# Patient Record
Sex: Female | Born: 1960 | Race: Black or African American | Hispanic: No | Smoking: Never smoker
Health system: Southern US, Community
[De-identification: ages and names within clinical notes are randomized; demographics above are authoritative.]

## PROBLEM LIST (undated history)

## (undated) DIAGNOSIS — I10 Essential (primary) hypertension: Secondary | ICD-10-CM

---

## 2014-09-15 ENCOUNTER — Encounter (HOSPITAL_COMMUNITY): Payer: Self-pay

## 2014-09-15 ENCOUNTER — Emergency Department (HOSPITAL_COMMUNITY): Payer: BC Managed Care – PPO

## 2014-09-15 ENCOUNTER — Emergency Department (HOSPITAL_COMMUNITY)
Admission: EM | Admit: 2014-09-15 | Discharge: 2014-09-15 | Disposition: A | Discharge status: Home / Self Care | Payer: BC Managed Care – PPO | Attending: Emergency Medicine | Admitting: Emergency Medicine

## 2014-09-15 DIAGNOSIS — Z79899 Other long term (current) drug therapy: Secondary | ICD-10-CM | POA: Diagnosis not present

## 2014-09-15 DIAGNOSIS — M545 Low back pain, unspecified: Secondary | ICD-10-CM

## 2014-09-15 DIAGNOSIS — I10 Essential (primary) hypertension: Secondary | ICD-10-CM

## 2014-09-15 DIAGNOSIS — R112 Nausea with vomiting, unspecified: Secondary | ICD-10-CM | POA: Diagnosis not present

## 2014-09-15 DIAGNOSIS — R109 Unspecified abdominal pain: Secondary | ICD-10-CM | POA: Diagnosis present

## 2014-09-15 HISTORY — DX: Essential (primary) hypertension: I10

## 2014-09-15 LAB — COMPREHENSIVE METABOLIC PANEL
ALBUMIN: 4.4 g/dL (ref 3.5–5.2)
ALT: 16 U/L (ref 0–35)
ANION GAP: 8 (ref 5–15)
AST: 20 U/L (ref 0–37)
Alkaline Phosphatase: 105 U/L (ref 39–117)
BUN: 11 mg/dL (ref 6–23)
CALCIUM: 8.8 mg/dL (ref 8.4–10.5)
CO2: 27 mmol/L (ref 19–32)
CREATININE: 0.56 mg/dL (ref 0.50–1.10)
Chloride: 105 mmol/L (ref 96–112)
Glucose, Bld: 119 mg/dL — ABNORMAL HIGH (ref 70–99)
Potassium: 3.2 mmol/L — ABNORMAL LOW (ref 3.5–5.1)
SODIUM: 140 mmol/L (ref 135–145)
Total Bilirubin: 0.5 mg/dL (ref 0.3–1.2)
Total Protein: 8 g/dL (ref 6.0–8.3)

## 2014-09-15 LAB — CBC WITH DIFFERENTIAL/PLATELET
Basophils Absolute: 0 10*3/uL (ref 0.0–0.1)
Basophils Relative: 0 % (ref 0–1)
Eosinophils Absolute: 0.1 10*3/uL (ref 0.0–0.7)
Eosinophils Relative: 1 % (ref 0–5)
HCT: 40.2 % (ref 36.0–46.0)
Hemoglobin: 13.1 g/dL (ref 12.0–15.0)
Lymphocytes Relative: 19 % (ref 12–46)
Lymphs Abs: 1.6 10*3/uL (ref 0.7–4.0)
MCH: 30 pg (ref 26.0–34.0)
MCHC: 32.6 g/dL (ref 30.0–36.0)
MCV: 92.2 fL (ref 78.0–100.0)
Monocytes Absolute: 0.3 10*3/uL (ref 0.1–1.0)
Monocytes Relative: 4 % (ref 3–12)
Neutro Abs: 6.4 10*3/uL (ref 1.7–7.7)
Neutrophils Relative %: 76 % (ref 43–77)
Platelets: 294 10*3/uL (ref 150–400)
RBC: 4.36 MIL/uL (ref 3.87–5.11)
RDW: 13.1 % (ref 11.5–15.5)
WBC: 8.4 10*3/uL (ref 4.0–10.5)

## 2014-09-15 LAB — URINE MICROSCOPIC-ADD ON

## 2014-09-15 LAB — URINALYSIS, ROUTINE W REFLEX MICROSCOPIC
Bilirubin Urine: NEGATIVE
Glucose, UA: NEGATIVE mg/dL
Ketones, ur: NEGATIVE mg/dL
LEUKOCYTES UA: NEGATIVE
NITRITE: NEGATIVE
Protein, ur: 100 mg/dL — AB
Specific Gravity, Urine: 1.013 (ref 1.005–1.030)
Urobilinogen, UA: 0.2 mg/dL (ref 0.0–1.0)
pH: 8 (ref 5.0–8.0)

## 2014-09-15 MED ORDER — SODIUM CHLORIDE 0.9 % IV BOLUS (SEPSIS)
1000.0000 mL | Freq: Once | INTRAVENOUS | Status: AC
  Administered 2014-09-15: 1000 mL via INTRAVENOUS

## 2014-09-15 MED ORDER — OXYCODONE-ACETAMINOPHEN 5-325 MG PO TABS: 1.0000 | ORAL_TABLET | Freq: Four times a day (QID) | ORAL | Status: AC | PRN

## 2014-09-15 MED ORDER — MORPHINE SULFATE 4 MG/ML IJ SOLN
4.0000 mg | Freq: Once | INTRAMUSCULAR | Status: AC
  Administered 2014-09-15: 4 mg via INTRAVENOUS
  Filled 2014-09-15: qty 1

## 2014-09-15 MED ORDER — ONDANSETRON HCL 4 MG/2ML IJ SOLN
4.0000 mg | Freq: Once | INTRAMUSCULAR | Status: AC
  Administered 2014-09-15: 4 mg via INTRAVENOUS
  Filled 2014-09-15: qty 2

## 2014-09-15 MED ORDER — POTASSIUM CHLORIDE CRYS ER 20 MEQ PO TBCR
40.0000 meq | EXTENDED_RELEASE_TABLET | Freq: Once | ORAL | Status: AC
  Administered 2014-09-15: 40 meq via ORAL
  Filled 2014-09-15: qty 2

## 2014-09-15 NOTE — ED Notes (Signed)
She c/o right flank pain with some vomiting since ~0100 today.  Nausea persists. She denies fever/dysuria; and is in no distress.  She does, however, report occasional "chills and sweats".

## 2014-09-15 NOTE — ED Provider Notes (Signed)
CSN: [id number]     Arrival date & time 09/15/14  7829 History   First MD Initiated Contact with Patient 09/15/14 [phone]     Chief Complaint  Patient presents with  . Flank Pain     (Consider location/radiation/quality/duration/timing/severity/associated sxs/prior Treatment) HPI  54 year old female presents with a sudden onset right low back pain that started yesterday evening around 7 PM. She's not doing anything in particular and the pain started all of a sudden. This progressively worsened since then. Is a constant pain that she has a hard time describing. Does not radiate anywhere. No pain in her leg. No weakness, numbness, or incontinence. No midline pain. The pain does not radiate into her groin and she has no abdominal pain. She threw up once and feels mildly nauseated. Rates the pain is severe currently. Denies any fevers or chills.  Past Medical History  Diagnosis Date  . Hypertension    No past surgical history on file. No family history on file. History  Substance Use Topics  . Smoking status: Never Smoker   . Smokeless tobacco: Not on file  . Alcohol Use: No   OB History    No data available     Review of Systems  Constitutional: Negative for fever.  Gastrointestinal: Positive for nausea and vomiting. Negative for abdominal pain.  Genitourinary: Negative for dysuria and hematuria.  Musculoskeletal: Positive for back pain.  Neurological: Negative for weakness and numbness.  All other systems reviewed and are negative.     Allergies  Review of patient's allergies indicates not on file.  Home Medications   Prior to Admission medications   Medication Sig Start Date End Date Taking? Authorizing Provider  atorvastatin (LIPITOR) 10 MG tablet  08/19/14   Historical Provider, MD  furosemide (LASIX) 20 MG tablet  08/19/14   Historical Provider, MD  hydrALAZINE (APRESOLINE) 50 MG tablet  08/19/14   Historical Provider, MD  metoprolol (LOPRESSOR) 100 MG tablet  08/19/14    Historical Provider, MD  NIFEdipine (PROCARDIA-XL/ADALAT CC) 30 MG 24 hr tablet  08/19/14   Historical Provider, MD   There were no vitals taken for this visit. Physical Exam  Constitutional: She is oriented to person, place, and time. She appears well-developed and well-nourished.  HENT:  Head: Normocephalic and atraumatic.  Right Ear: External ear normal.  Left Ear: External ear normal.  Nose: Nose normal.  Eyes: Right eye exhibits no discharge. Left eye exhibits no discharge.  Cardiovascular: Normal rate, regular rhythm and normal heart sounds.   Pulmonary/Chest: Effort normal and breath sounds normal.  Abdominal: Soft. She exhibits no distension. There is no tenderness.  Patient feels "uncomfortable" when I palpate R low/mid abdomen but denies pain/tenderness  Musculoskeletal:       Lumbar back: She exhibits tenderness. She exhibits no bony tenderness.       Back:  Neurological: She is alert and oriented to person, place, and time.  Skin: Skin is warm and dry.  Nursing note and vitals reviewed.   ED Course  Procedures (including critical care time) Labs Review Labs Reviewed  COMPREHENSIVE METABOLIC PANEL - Abnormal; Notable for the following:    Potassium 3.2 (*)    Glucose, Bld 119 (*)    All other components within normal limits  URINALYSIS, ROUTINE W REFLEX MICROSCOPIC - Abnormal; Notable for the following:    Hgb urine dipstick TRACE (*)    Protein, ur 100 (*)    All other components within normal limits  CBC WITH DIFFERENTIAL/PLATELET  URINE MICROSCOPIC-ADD ON    Imaging Review Ct Renal Stone Study  09/15/2014   CLINICAL DATA:  Acute right flank pain.  EXAM: CT ABDOMEN AND PELVIS WITHOUT CONTRAST  TECHNIQUE: Multidetector CT imaging of the abdomen and pelvis was performed following the standard protocol without IV contrast.  COMPARISON:  None.  FINDINGS: Moderate degenerative disc disease is noted at L5-S1. Visualized lung bases appear normal.  No gallstones are  noted. No focal abnormality is noted in the liver, spleen or pancreas on these unenhanced images. Adrenal glands and kidneys appear normal except for 2.5 cm exophytic cyst arising posteriorly from left kidney. No hydronephrosis or renal obstruction is noted. No renal or ureteral calculi are noted. The appendix appears normal. There is no evidence of bowel obstruction. Atherosclerotic calcifications of abdominal aorta are noted without aneurysm formation. Urinary bladder appears normal. Status post hysterectomy. No significant adenopathy is noted.  IMPRESSION: No hydronephrosis or renal obstruction is noted. No renal or ureteral calculi are noted. No acute abnormality is noted in the abdomen or pelvis.   Electronically Signed   By: Lupita RaiderJames  Green Jr, M.D.   On: 09/15/2014 10:11     EKG Interpretation None      MDM   Final diagnoses:  Right-sided low back pain without sciatica  Essential hypertension    No obvious cause for patient's back pain. She is neurologically intact and no midline tenderness to suggest a spinal source. No recent injury. CT negative, including normal caliber aorta and no obvious stone. Does have hematuria on UA, could be recently passed stone. Will treat with pain meds and d/c home. BP has significantly decreased with pain control.    Audree CamelScott T Felton Buczynski, MD 09/15/14 [phone]

## 2016-02-28 IMAGING — CT CT RENAL STONE PROTOCOL
1 series · 14 of 20 positions shown, 19 images · non-contrast
Comparison: None.

CLINICAL DATA: Acute right flank pain.

EXAM:
CT ABDOMEN AND PELVIS WITHOUT CONTRAST
TECHNIQUE: Multidetector CT imaging of the abdomen and pelvis was performed
following the standard protocol without IV contrast.

[Series 4: lung · axial · 0.67mm/px · z∈[+1468,+1554]mm · 14 of 20 slices shown, 19 images]
[im 2/20  soft-tissue]
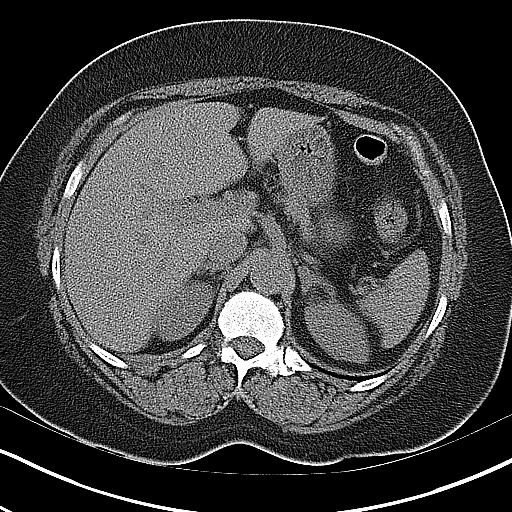
[im 2/20  bone]
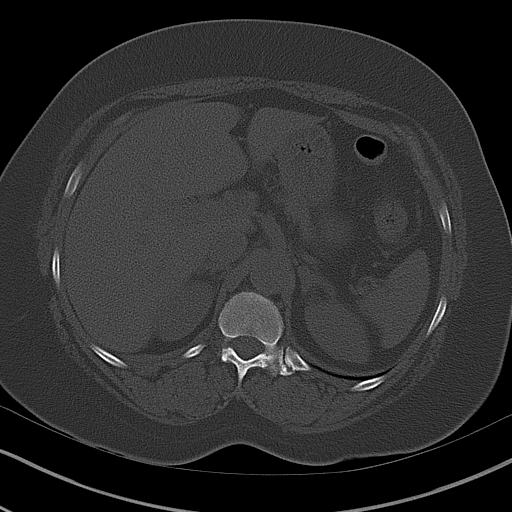
[im 4/20  soft-tissue]
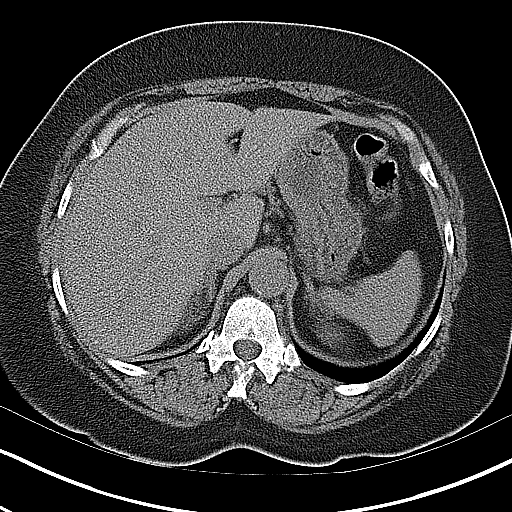
[im 5/20  soft-tissue]
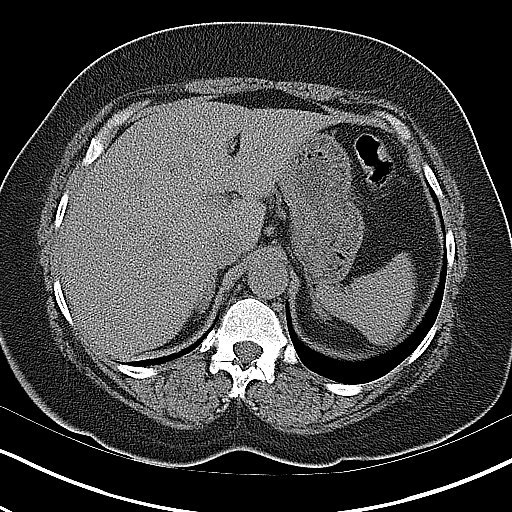
[im 6/20  soft-tissue]
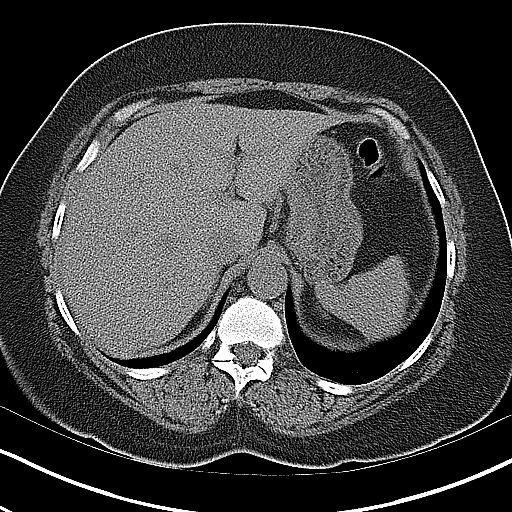
[im 8/20  soft-tissue]
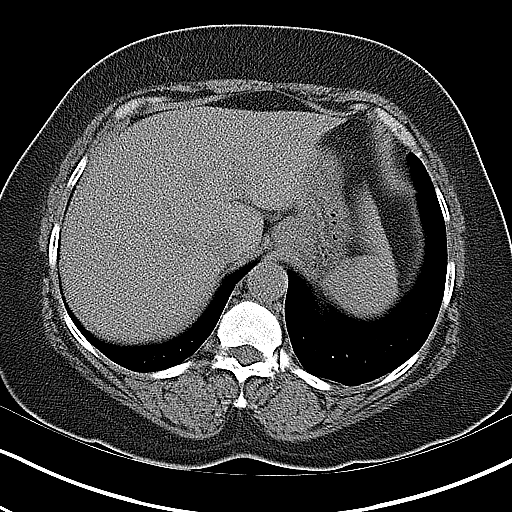
[im 9/20  soft-tissue]
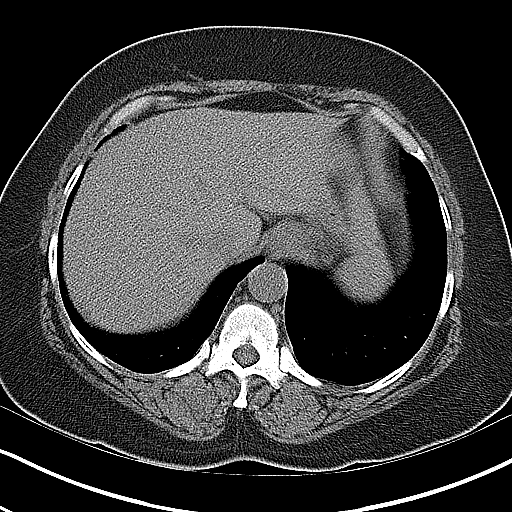
[im 11/20  soft-tissue]
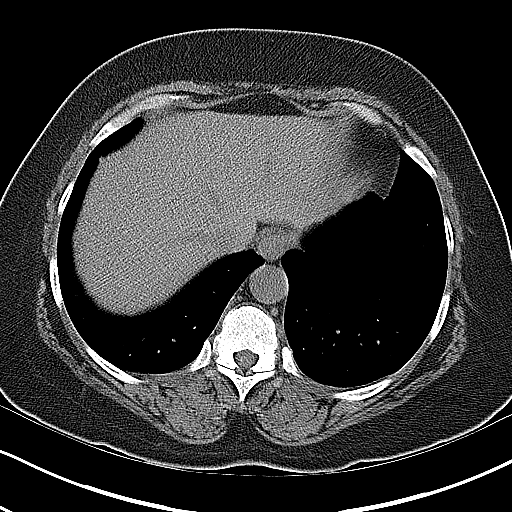
[im 12/20  soft-tissue]
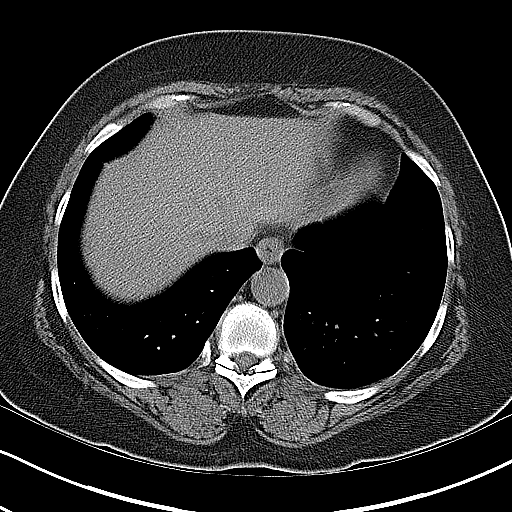
[im 13/20  soft-tissue]
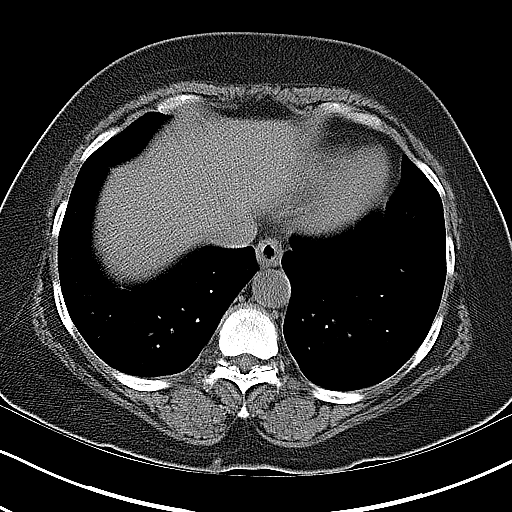
[im 13/20  bone]
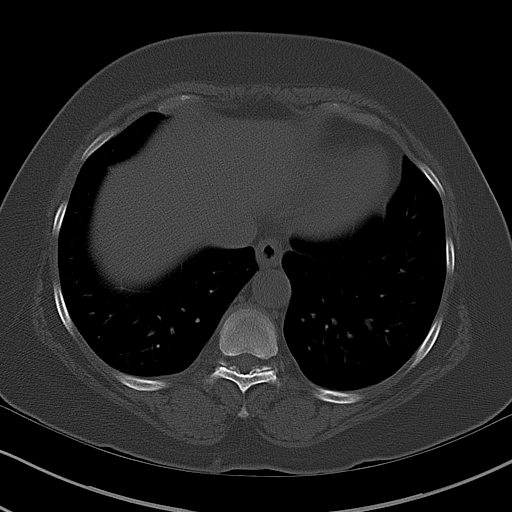
[im 15/20  soft-tissue]
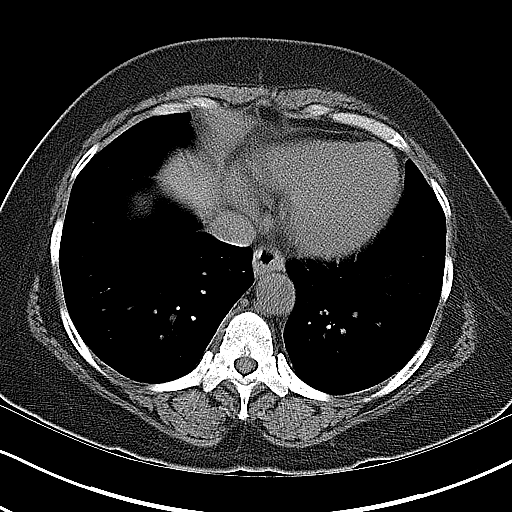
[im 16/20  soft-tissue]
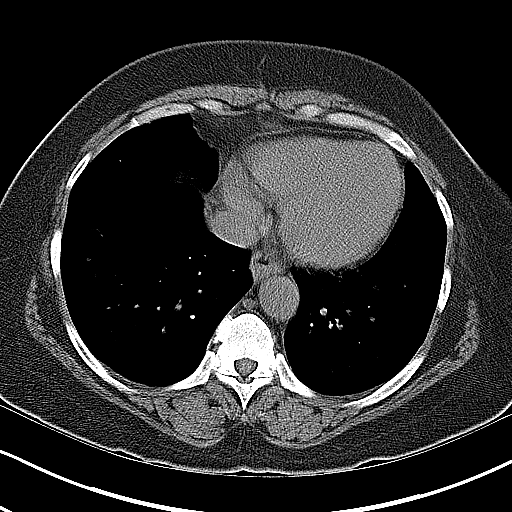
[im 16/20  lung]
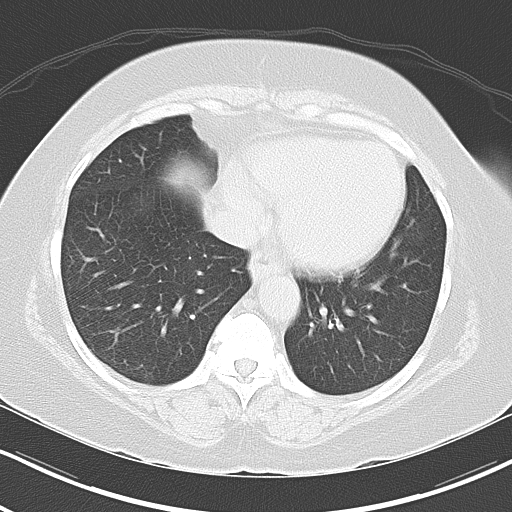
[im 17/20  soft-tissue]
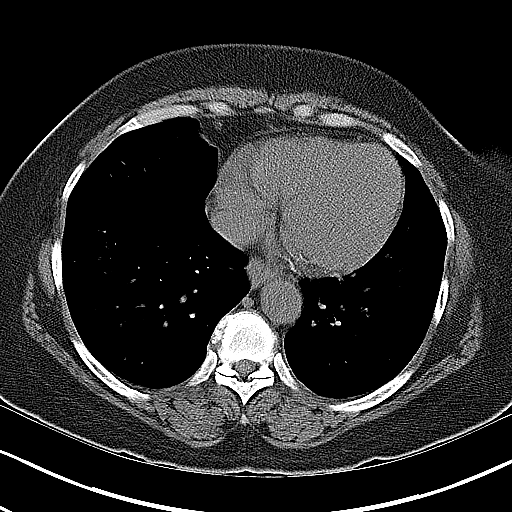
[im 17/20  lung]
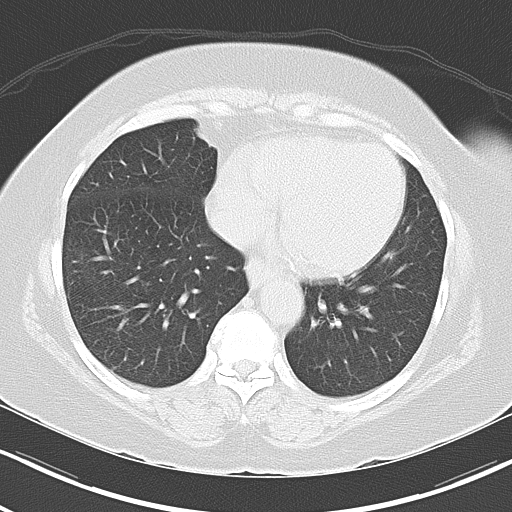
[im 18/20  lung]
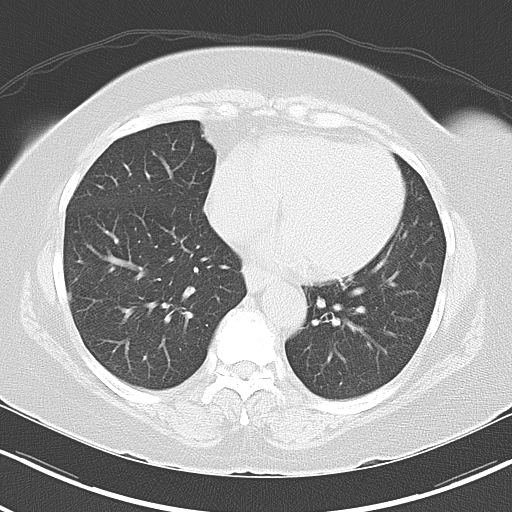
[im 19/20  soft-tissue]
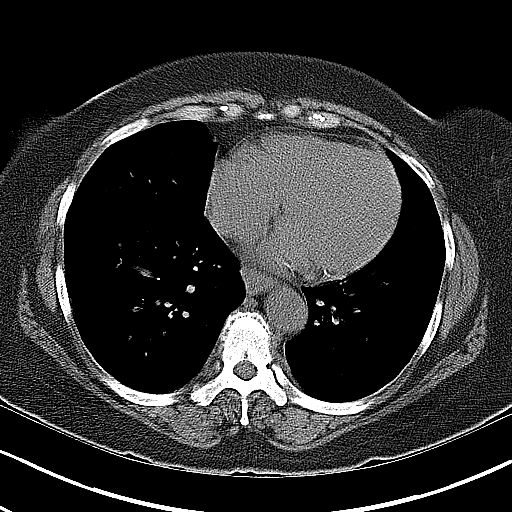
[im 19/20  lung]
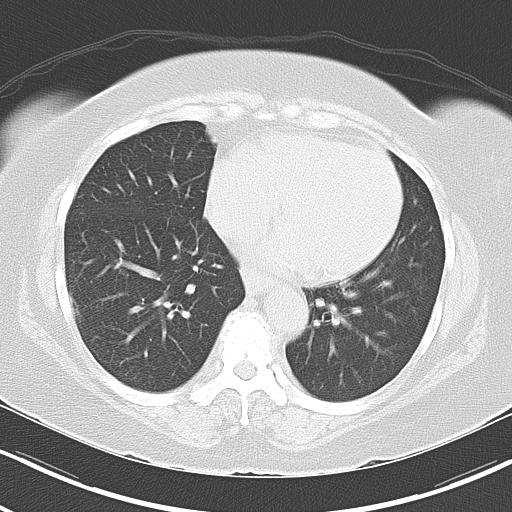

[14 of 20 positions shown; findings below may reference images not displayed]

FINDINGS: Moderate degenerative disc disease is noted at L5-S1. Visualized
lung bases appear normal.

No gallstones are noted. No focal abnormality is noted in the liver,
spleen or pancreas on these unenhanced images. Adrenal glands and
kidneys appear normal except for 2.5 cm exophytic cyst arising
posteriorly from left kidney. No hydronephrosis or renal obstruction
is noted. No renal or ureteral calculi are noted. The appendix
appears normal. There is no evidence of bowel obstruction.
Atherosclerotic calcifications of abdominal aorta are noted without
aneurysm formation. Urinary bladder appears normal. Status post
hysterectomy. No significant adenopathy is noted.
IMPRESSION: No hydronephrosis or renal obstruction is noted. No renal or
ureteral calculi are noted. No acute abnormality is noted in the
abdomen or pelvis.
